# Patient Record
Sex: Male | Born: 1995 | Race: Black or African American | Hispanic: No | Marital: Single | Smoking: Never smoker
Health system: Southern US, Community
[De-identification: ages and names within clinical notes are randomized; demographics above are authoritative.]

---

## 2018-07-28 ENCOUNTER — Emergency Department (HOSPITAL_COMMUNITY)
Admission: EM | Admit: 2018-07-28 | Discharge: 2018-07-29 | Disposition: A | Discharge status: Home / Self Care | Payer: 59 | Attending: Emergency Medicine | Admitting: Emergency Medicine

## 2018-07-28 DIAGNOSIS — Y999 Unspecified external cause status: Secondary | ICD-10-CM | POA: Insufficient documentation

## 2018-07-28 DIAGNOSIS — S0181XA Laceration without foreign body of other part of head, initial encounter: Secondary | ICD-10-CM | POA: Diagnosis not present

## 2018-07-28 DIAGNOSIS — Z23 Encounter for immunization: Secondary | ICD-10-CM | POA: Insufficient documentation

## 2018-07-28 DIAGNOSIS — Y9389 Activity, other specified: Secondary | ICD-10-CM | POA: Insufficient documentation

## 2018-07-28 DIAGNOSIS — Y929 Unspecified place or not applicable: Secondary | ICD-10-CM | POA: Diagnosis not present

## 2018-07-28 DIAGNOSIS — S0990XA Unspecified injury of head, initial encounter: Secondary | ICD-10-CM | POA: Diagnosis present

## 2018-07-28 DIAGNOSIS — W25XXXA Contact with sharp glass, initial encounter: Secondary | ICD-10-CM | POA: Insufficient documentation

## 2018-07-29 ENCOUNTER — Other Ambulatory Visit: Payer: Self-pay

## 2018-07-29 ENCOUNTER — Encounter (HOSPITAL_COMMUNITY): Payer: Self-pay | Admitting: Emergency Medicine

## 2018-07-29 ENCOUNTER — Emergency Department (HOSPITAL_COMMUNITY): Payer: 59

## 2018-07-29 MED ORDER — TETANUS-DIPHTH-ACELL PERTUSSIS 5-2.5-18.5 LF-MCG/0.5 IM SUSP
0.5000 mL | Freq: Once | INTRAMUSCULAR | Status: AC
  Administered 2018-07-29: 0.5 mL via INTRAMUSCULAR
  Filled 2018-07-29: qty 0.5

## 2018-07-29 MED ORDER — CEPHALEXIN 500 MG PO CAPS: 500.0000 mg | ORAL_CAPSULE | Freq: Three times a day (TID) | ORAL | 0 refills | Status: AC

## 2018-07-29 MED ORDER — LIDOCAINE-EPINEPHRINE (PF) 2 %-1:200000 IJ SOLN
10.0000 mL | Freq: Once | INTRAMUSCULAR | Status: AC
  Administered 2018-07-29: 10 mL via INTRADERMAL
  Filled 2018-07-29: qty 20

## 2018-07-29 NOTE — Discharge Instructions (Addendum)
For your wound:  Apply antibiotic ointment or petroleum jelly to the wound two to three times a day until fully healed  Do not swim or place your head fully underwater for 2 weeks  Keep the wound away form the sun  Keep the wound clean  Take tylenol or ibuprofen for pain
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
## 2018-07-29 NOTE — ED Notes (Signed)
Remains in waiting room.
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
